# Patient Record
Sex: Male | Born: 1985 | Race: Black or African American | Hispanic: No | Marital: Single | Smoking: Never smoker
Health system: Southern US, Community
[De-identification: ages and names within clinical notes are randomized; demographics above are authoritative.]

---
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
## 2007-12-02 ENCOUNTER — Emergency Department: Payer: Self-pay | Admitting: Emergency Medicine

## 2009-04-08 ENCOUNTER — Emergency Department: Payer: Self-pay | Admitting: Emergency Medicine

## 2009-07-04 ENCOUNTER — Emergency Department: Payer: Self-pay | Admitting: Emergency Medicine

## 2010-02-26 ENCOUNTER — Emergency Department: Payer: Self-pay | Admitting: Emergency Medicine

## 2011-08-20 IMAGING — CT CT ABD-PELV W/ CM
1 of 3 series · 14 of 32 positions shown, 19 images · non-contrast
Comparison: none

REASON FOR EXAM: (1) generalized abdominal pain, vomiting; (2)
generalized abdominal pain, vomiti
COMMENTS:

PROCEDURE:     CT  - CT ABDOMEN / PELVIS  W  - February 27, 2010  [DATE]
RESULT:
TECHNIQUE: Helical 3 mm sections were obtained from the lung bases through
the pubic symphysis status post intravenous administration of 100 ml of
Tsovue-3I6 and oral contrast.

[Series 2: 3mm soft tissue · axial · 0.61mm/px · z∈[-316,+71]mm · 14 of 145 slices shown, 19 images]
[im 8/145  soft-tissue]
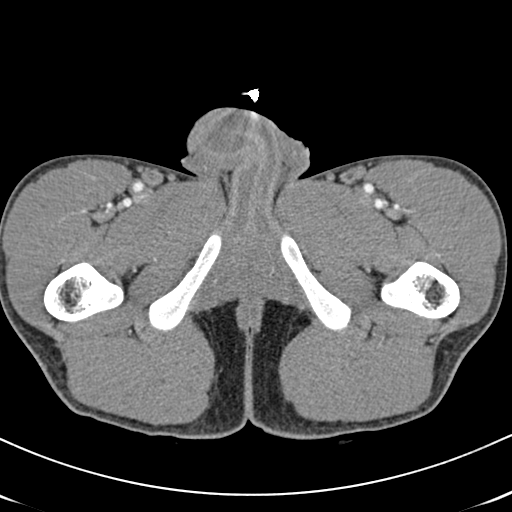
[im 8/145  bone]
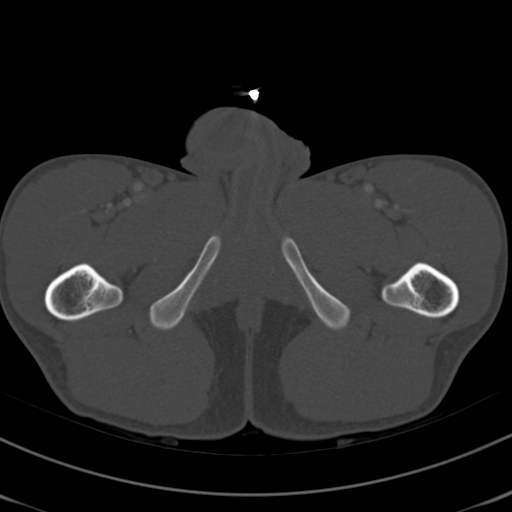
[im 23/145  soft-tissue]
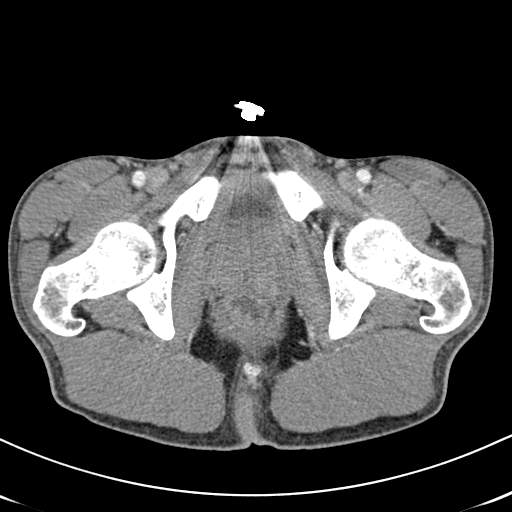
[im 31/145  soft-tissue]
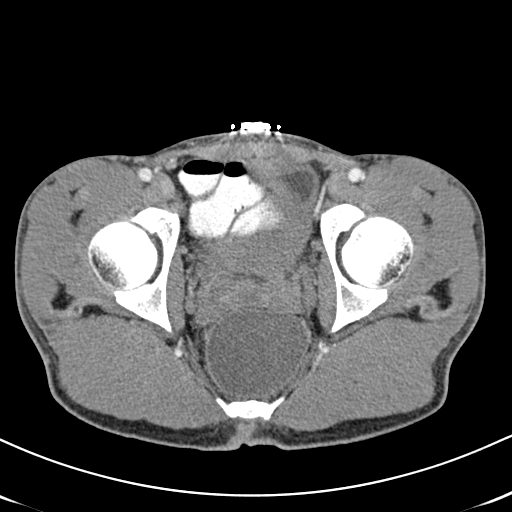
[im 38/145  soft-tissue]
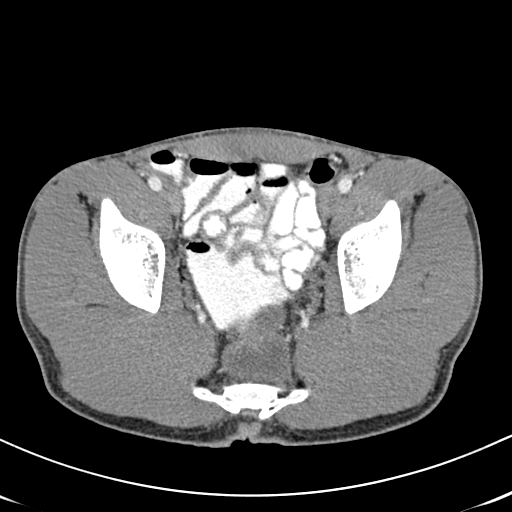
[im 54/145  soft-tissue]
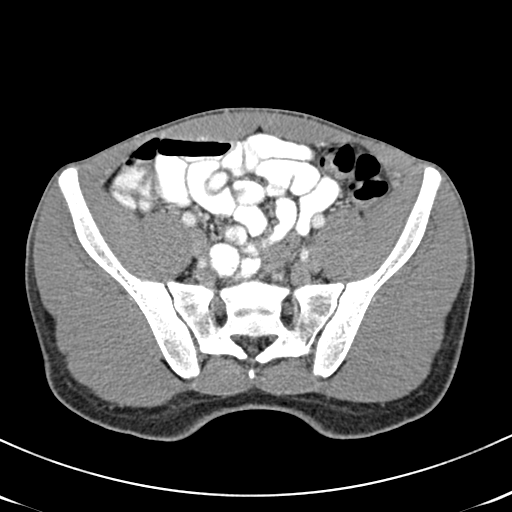
[im 61/145  soft-tissue]
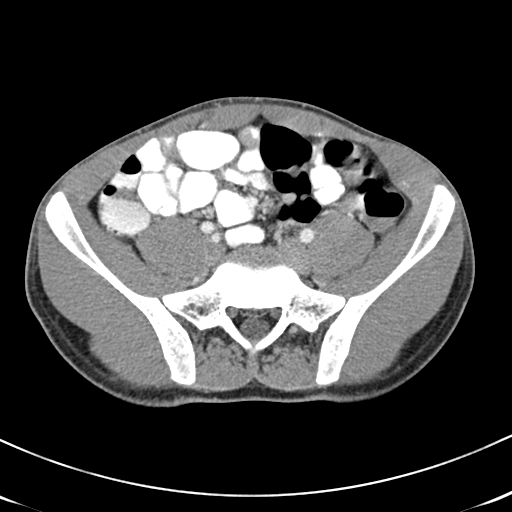
[im 76/145  soft-tissue]
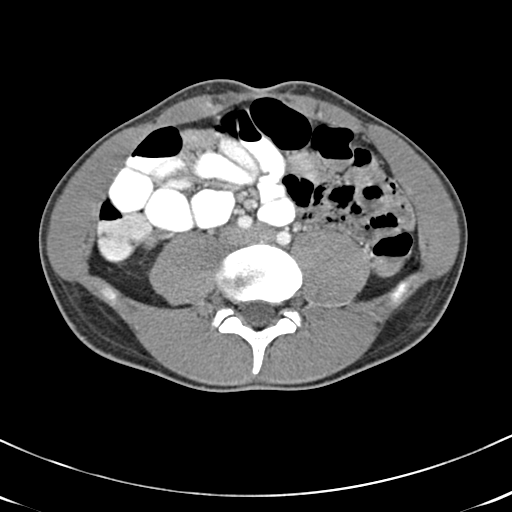
[im 84/145  soft-tissue]
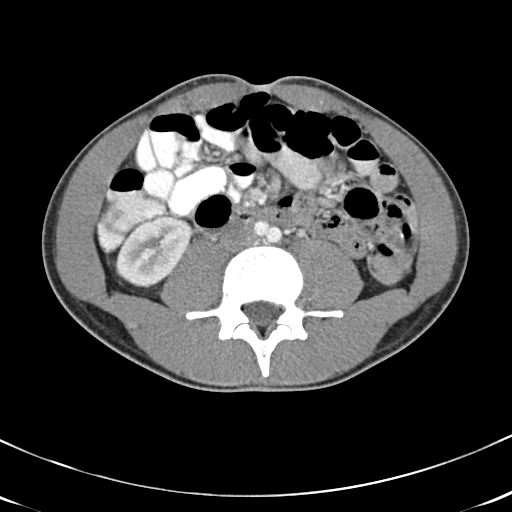
[im 91/145  soft-tissue]
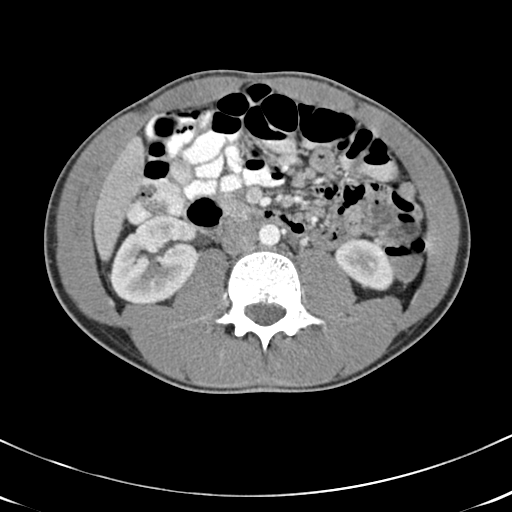
[im 91/145  bone]
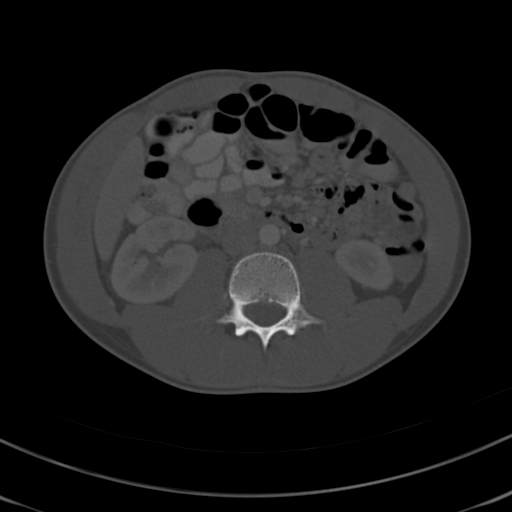
[im 107/145  soft-tissue]
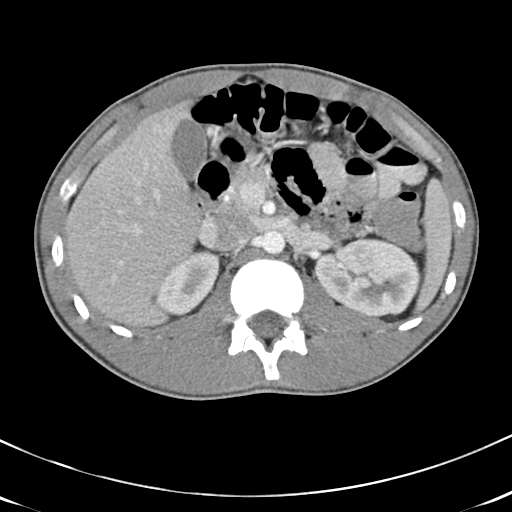
[im 114/145  soft-tissue]
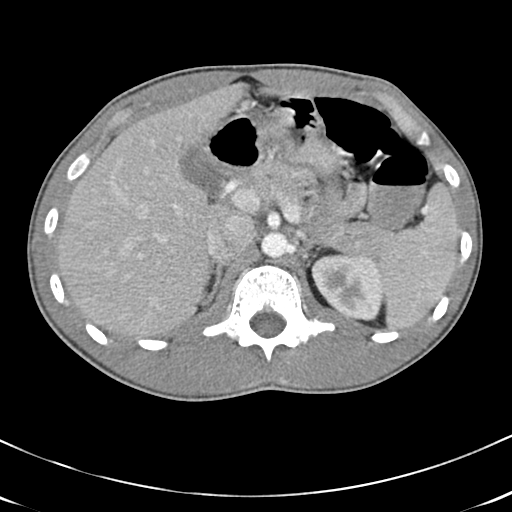
[im 114/145  lung]
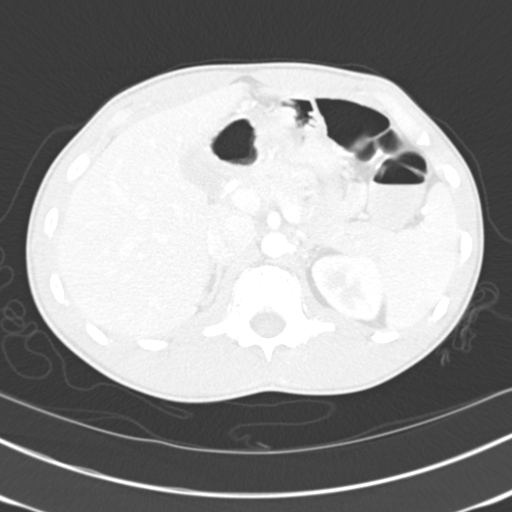
[im 122/145  soft-tissue]
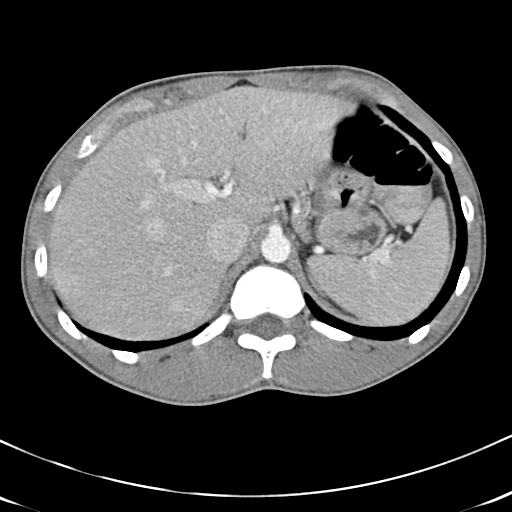
[im 122/145  lung]
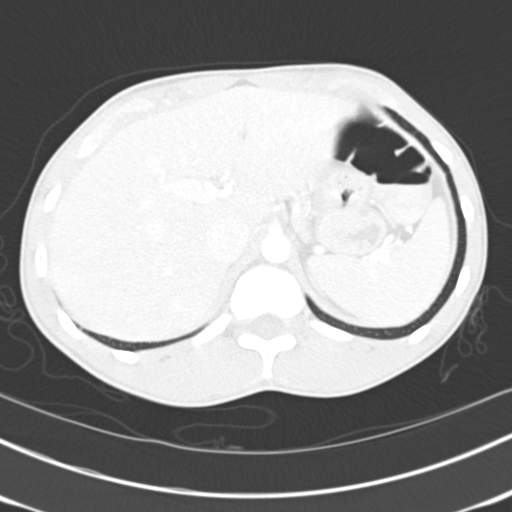
[im 129/145  lung]
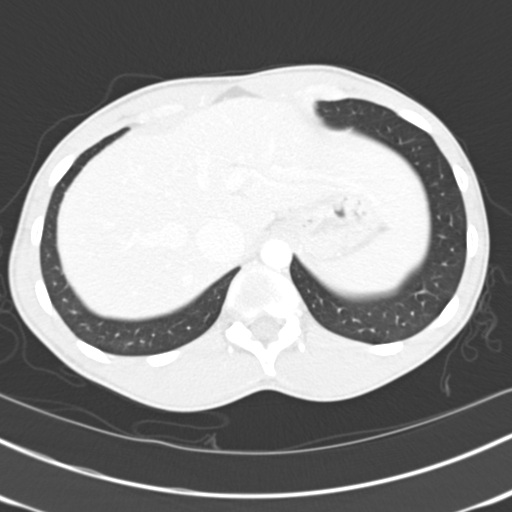
[im 137/145  soft-tissue]
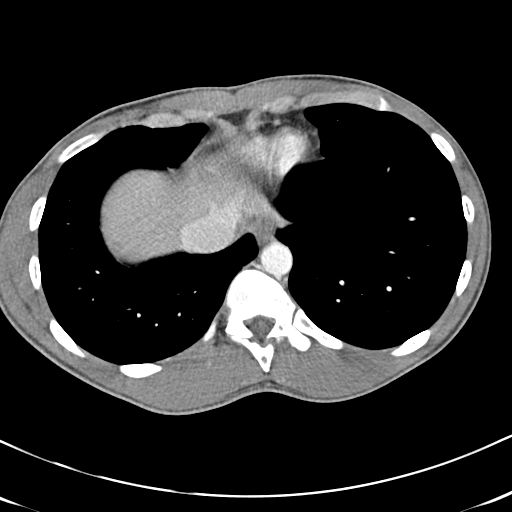
[im 137/145  lung]
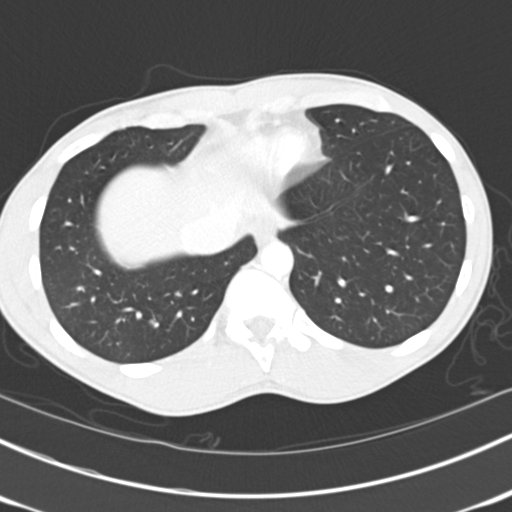

[14 of 32 positions shown; findings below may reference images not displayed]

FINDINGS: The lung bases are unremarkable.

The liver, spleen, adrenals, pancreas, and kidneys are unremarkable. There
is no CT evidence of bowel obstruction nor secondary signs reflecting
appendicitis, enteritis nor colitis. There is no evidence of an abdominal
aortic aneurysm. The celiac, SMA, IMA, and portal vein are unremarkable.

Evaluation of the pelvis demonstrates diffuse thickening of the urinary
bladder wall a component of which is secondary to incomplete distention. The
rectum is distended and fluid-filled.
IMPRESSION: 1. Urinary bladder wall thickening a component of which is secondary to
incomplete distention. An etiology such as cystitis cannot be excluded if
clinically appropriate.
2. Fluid-filled rectum.

Dr. Engelbrecht of the Emergency Department was informed of these findings via a
preliminary faxed report.

## 2012-12-30 ENCOUNTER — Emergency Department: Payer: Self-pay | Admitting: Emergency Medicine

## 2013-04-16 ENCOUNTER — Emergency Department: Payer: Self-pay | Admitting: Emergency Medicine

## 2013-04-16 LAB — URINALYSIS, COMPLETE
BACTERIA: NONE SEEN
BLOOD: NEGATIVE
Bilirubin,UR: NEGATIVE
Glucose,UR: NEGATIVE mg/dL (ref 0–75)
LEUKOCYTE ESTERASE: NEGATIVE
Nitrite: NEGATIVE
Ph: 6 (ref 4.5–8.0)
Protein: NEGATIVE
Specific Gravity: 1.026 (ref 1.003–1.030)
Squamous Epithelial: NONE SEEN

## 2013-04-16 LAB — COMPREHENSIVE METABOLIC PANEL
ALBUMIN: 4.5 g/dL (ref 3.4–5.0)
ALT: 20 U/L (ref 12–78)
Alkaline Phosphatase: 76 U/L
Anion Gap: 8 (ref 7–16)
BUN: 11 mg/dL (ref 7–18)
Bilirubin,Total: 1.1 mg/dL — ABNORMAL HIGH (ref 0.2–1.0)
CALCIUM: 9.1 mg/dL (ref 8.5–10.1)
CHLORIDE: 103 mmol/L (ref 98–107)
CO2: 25 mmol/L (ref 21–32)
CREATININE: 1.03 mg/dL (ref 0.60–1.30)
EGFR (African American): >60
EGFR (Non-African Amer.): >60
Glucose: 108 mg/dL — ABNORMAL HIGH (ref 65–99)
OSMOLALITY: 272 (ref 275–301)
Potassium: 3.6 mmol/L (ref 3.5–5.1)
SGOT(AST): 36 U/L (ref 15–37)
SODIUM: 136 mmol/L (ref 136–145)
Total Protein: 8.3 g/dL — ABNORMAL HIGH (ref 6.4–8.2)

## 2013-04-16 LAB — DRUG SCREEN, URINE
Amphetamines, Ur Screen: NEGATIVE (ref ?–1000)
Barbiturates, Ur Screen: POSITIVE (ref ?–200)
Benzodiazepine, Ur Scrn: NEGATIVE (ref ?–200)
Cannabinoid 50 Ng, Ur ~~LOC~~: POSITIVE (ref ?–50)
Cocaine Metabolite,Ur ~~LOC~~: NEGATIVE (ref ?–300)
MDMA (ECSTASY) UR SCREEN: NEGATIVE (ref ?–500)
Methadone, Ur Screen: NEGATIVE (ref ?–300)
Opiate, Ur Screen: NEGATIVE (ref ?–300)
Phencyclidine (PCP) Ur S: NEGATIVE (ref ?–25)
Tricyclic, Ur Screen: NEGATIVE (ref ?–1000)

## 2013-04-16 LAB — CBC
HCT: 42.5 % (ref 40.0–52.0)
HGB: 14.6 g/dL (ref 13.0–18.0)
MCH: 30 pg (ref 26.0–34.0)
MCHC: 34.4 g/dL (ref 32.0–36.0)
MCV: 87 fL (ref 80–100)
PLATELETS: 215 10*3/uL (ref 150–440)
RBC: 4.88 10*6/uL (ref 4.40–5.90)
RDW: 12.7 % (ref 11.5–14.5)
WBC: 7.4 10*3/uL (ref 3.8–10.6)

## 2013-04-16 LAB — ETHANOL: Ethanol %: <0.003 % (ref 0.000–0.080)

## 2013-04-16 LAB — ACETAMINOPHEN LEVEL
ACETAMINOPHEN: 6 ug/mL — AB
Acetaminophen: 9 ug/mL — ABNORMAL LOW

## 2013-04-16 LAB — SALICYLATE LEVEL: Salicylates, Serum: <1.7 mg/dL

## 2014-07-13 ENCOUNTER — Emergency Department
Admit: 2014-07-13 | Disposition: A | Discharge status: Home / Self Care | Payer: Self-pay | Admitting: Emergency Medicine

## 2014-07-15 NOTE — Consult Note (Signed)
PATIENT NAME:  Robert Baxter, Raheim A MR#:  [id number] DATE OF BIRTH:  February 06, 1986  DATE OF CONSULTATION:  04/17/2013  REFERRING PHYSICIAN:   CONSULTING PHYSICIAN:  Lavine Hargrove K. Abeeha Twist, MD  [facility] [place] [place].  SUBJECTIVE:  The patient is a 29 year old [place] male not employed, looking for a job and currently lives with family members.  The patient was brought to Emergency Room and according to information obtained the patient was not very compliant with medications and he is very antisocial and has no impulse control.  He likes to hurt himself, however, the patient denies these statements and he reported that he is eager to go home.    ALCOHOL AND DRUGS:  Occasionally drinks alcohol, denies drinking alcohol on a regular basis.    PAST PSYCHIATRIC HISTORY:  Denies any previous inpatient psychiatry.  Denies any suicide attempts.    OBJECTIVE:  Dressed in hospital clothes.  Alert and oriented, calm, pleasant and cooperative.  Denies feeling depressed, denies feeling hopeless or helpless.  Denies hearing voices or seeing things.  Denies any paranoia or suspicious ideas.  Contracts for safety, is eager to go home and look for a job.  Insight and judgment guarded.    IMPRESSION:  According to information obtained, impulse control disorder.    RECOMMENDATIONS:  The patient was [facility] because he is very antisocial with very poor impulse control.  Mr. Theodosia PalingKent Smith is to evaluate the patient on 04/18/2013 to see how he has been doing and consider appropriate placement as needed.    ____________________________ Jannet MantisSurya K. Guss Bundehalla, MD skc:cs D: 04/17/2013 18:17:48 ET T: 04/17/2013 19:28:49 ET JOB#: [id number]  cc: Monika SalkSurya K. Guss Bundehalla, MD, <Dictator> Beau FannySURYA K Margareth Kanner MD ELECTRONICALLY SIGNED 04/19/2013 6:45

## 2014-07-15 NOTE — Consult Note (Signed)
PATIENT NAME:  Robert Baxter, Robert Baxter MR#:  [id number] DATE OF BIRTH:  10-05-1985  DATE OF CONSULTATION:  04/18/2013  REFERRING PHYSICIAN:  Su Ley, MD CONSULTING PHYSICIAN:  Vonnetta Akey B. Essynce Munsch, MD  REASON FOR CONSULTATION: To evaluate suicidal patient.   IDENTIFYING DATA: Robert Baxter is a 29 year old male with no past psychiatric history.   CHIEF COMPLAINT: "I'm fine."   HISTORY OF PRESENT ILLNESS: Robert Baxter has no psychiatric history except for self-mutilating behavior as evidenced by bruises, scars on his forearm. The patient was brought to the hospital by his aunt. She found him sleep on the floor. The patient admitted to taking 7 Fioricet  that belonged to his girlfriend and were prescribed for her headaches. He denies that he overdosed in a suicide attempt at the time of my interview. At the beginning, he was bragging about taking pills and also indicated that in the past few months he tried to hang himself and then drown himself. To me, the patient denies any symptoms of depression, anxiety or psychosis. He admits to drinking alcohol on occasion, very infrequently. He denies using illicit substance. Of note, the patient was extremely irritable at the time of initial admission. The following day, he is pleasant, polite and cooperative, cool and collected. He is unable to explain this change in demeanor. He reports that he has a girl of 18 months and recently they have arguing some.  He punched a hole in the wall. He was upset when taking the pills.   PAST PSYCHIATRIC HISTORY: Denies any hospitalizations, no history of treatment. Admits to self-mutilating behavior.   FAMILY PSYCHIATRIC HISTORY: None reported.   PAST MEDICAL HISTORY: None.   ALLERGIES: No known drug allergies.   MEDICATIONS ON ADMISSION: None.   SOCIAL HISTORY: He lives with his aunt recently. Because of the heat-type problems, he  moved in with his grandma. His girlfriend lives with his parents. He believes that this is  a good relationship to last. He does not work and is looking for employment. He has some criminal past but denies any current charges pending.   REVIEW OF SYSTEMS: CONSTITUTIONAL: No fevers or chills. No weight changes.  EYES: No double or blurred vision.  ENT: No hearing loss.  RESPIRATORY: No shortness of breath or cough.  CARDIOVASCULAR: No chest pain or orthopnea.  GASTROINTESTINAL: No abdominal pain, nausea, vomiting or diarrhea.  GENITOURINARY: No incontinence or frequency.  ENDOCRINE: No heat or cold intolerance.  LYMPHATIC: No anemia or easy bruising.  INTEGUMENTARY: No acne or rash.  MUSCULOSKELETAL: No muscle or joint pain.  NEUROLOGIC: No tingling or weakness.  PSYCHIATRIC: See history of present illness for details.   PHYSICAL EXAMINATION: VITAL SIGNS: Blood pressure 106/68, pulse 70, respirations 18, temperature 97.1.  GENERAL: This is a slender young male in no acute distress. The rest of the physical examination is deferred to his primary attending.   LABORATORY, DIAGNOSTIC AND RADIOLOGICAL DATA:  Chemistries are within normal limits. Blood alcohol level is zero. LFTs within normal limits except for total bili of 1.1. Urine tox screen is positive for barbiturates and cannabinoids. CBC within normal limits. Urinalysis is not suggestive of urinary tract infection. Serum acetaminophen is 9, serum salicylates at 1.7. EKG normal sinus rhythm, possible left atrial enlargement, borderline EKG.   MENTAL STATUS EXAMINATION: The patient is alert and oriented to person, place, time and situation. He is pleasant, polite and cooperative. He is relaxed, no behavioral problems, no irritability. He maintains good eye contact. His speech is  of normal rhythm, rate and volume, rather soft. Mood is good with a full affect. Thought process is logical and goal oriented. Thought content: He denies suicidal or homicidal ideation. There are no delusions or paranoia. There are no auditory or visual  hallucinations. His cognition is grossly intact. His insight and judgment are questionable.    DIAGNOSES: AXIS I:  Cannabis abuse, sedative abuse.  AXIS II: Deferred.  AXIS III: Deferred.  AXIS IV: Mental illness, employment, financial.  AXIS V: Global assessment of functioning 45.   PLAN:  1.  The patient no longer meets criteria for involuntary inpatient psychiatric commitment. Please discharge as appropriate. I will terminate proceedings.    2.  No medication recommended.  3.  The patient was given information about [facility] treatment program.  4.  His family pick him up.    ____________________________ Ellin GoodieJolanta B. Jennet MaduroPucilowska, MD jbp:cs D: 04/18/2013 19:45:19 ET T: 04/18/2013 20:55:52 ET JOB#: [id number]  cc: Ayven Glasco B. Jennet MaduroPucilowska, MD, <Dictator> Shari ProwsJOLANTA B Navdeep Fessenden MD ELECTRONICALLY SIGNED 05/15/2013 6:42

## 2014-07-15 NOTE — Consult Note (Signed)
Brief Consult Note: Diagnosis: No diagnosis.   Patient was seen by consultant.   Consult note dictated.   Recommend further assessment or treatment.   Comments: Mr. Marice PotterDove has no psychiatric past. He was brought to ER after injesting 7 pills of Fioricet. He denies suicide intent. He denies suicidal or homicidal ideation.  PLAN: 1. The patgient mno longer meets crfiteria for IVC. I will termintae proceedings. Please discharge as appropriate.  2. No medications recommended.  3. He has information about crisis center.  4. His sister will pick him up.  Electronic Signatures: Kristine LineaPucilowska, Janeva Peaster (MD)  (Signed 26-Jan-15 14:23)  Authored: Brief Consult Note   Last Updated: 26-Jan-15 14:23 by Kristine LineaPucilowska, Cherylann Hobday (MD)

## 2014-07-27 ENCOUNTER — Emergency Department
Admission: EM | Admit: 2014-07-27 | Discharge: 2014-07-27 | Disposition: A | Discharge status: Home / Self Care | Payer: Self-pay | Attending: Emergency Medicine | Admitting: Emergency Medicine

## 2014-07-27 DIAGNOSIS — Y9289 Other specified places as the place of occurrence of the external cause: Secondary | ICD-10-CM | POA: Insufficient documentation

## 2014-07-27 DIAGNOSIS — Y9389 Activity, other specified: Secondary | ICD-10-CM | POA: Insufficient documentation

## 2014-07-27 DIAGNOSIS — Y998 Other external cause status: Secondary | ICD-10-CM | POA: Insufficient documentation

## 2014-07-27 DIAGNOSIS — X58XXXA Exposure to other specified factors, initial encounter: Secondary | ICD-10-CM | POA: Insufficient documentation

## 2014-07-27 DIAGNOSIS — Z0000 Encounter for general adult medical examination without abnormal findings: Secondary | ICD-10-CM | POA: Insufficient documentation

## 2014-07-27 DIAGNOSIS — IMO0001 Reserved for inherently not codable concepts without codable children: Secondary | ICD-10-CM

## 2014-07-27 LAB — COMPREHENSIVE METABOLIC PANEL
ALK PHOS: 55 U/L (ref 38–126)
ALT: 13 U/L — ABNORMAL LOW (ref 17–63)
AST: 33 U/L (ref 15–41)
Albumin: 4.6 g/dL (ref 3.5–5.0)
Anion gap: 12 (ref 5–15)
BUN: 15 mg/dL (ref 6–20)
CO2: 25 mmol/L (ref 22–32)
Calcium: 10 mg/dL (ref 8.9–10.3)
Chloride: 103 mmol/L (ref 101–111)
Creatinine, Ser: 1.01 mg/dL (ref 0.61–1.24)
GFR calc non Af Amer: >60 mL/min (ref >60)
GLUCOSE: 110 mg/dL — AB (ref 65–99)
POTASSIUM: 3.6 mmol/L (ref 3.5–5.1)
SODIUM: 140 mmol/L (ref 135–145)
TOTAL PROTEIN: 8.5 g/dL — AB (ref 6.5–8.1)
Total Bilirubin: 1.6 mg/dL — ABNORMAL HIGH (ref 0.3–1.2)

## 2014-07-27 LAB — URINE DRUG SCREEN, QUALITATIVE (ARMC ONLY)
AMPHETAMINES, UR SCREEN: 0 — AB
Barbiturates, Ur Screen: 0 — AB
Benzodiazepine, Ur Scrn: 0 — AB
CANNABINOID 50 NG, UR ~~LOC~~: 4 — AB
Cocaine Metabolite,Ur ~~LOC~~: 0 — AB
MDMA (Ecstasy)Ur Screen: 0 — AB
METHADONE SCREEN, URINE: 0 — AB
Opiate, Ur Screen: 0 — AB
Phencyclidine (PCP) Ur S: 0 — AB
Tricyclic, Ur Screen: 4 — AB

## 2014-07-27 LAB — URINALYSIS COMPLETE WITH MICROSCOPIC (ARMC ONLY)
BACTERIA UA: NONE SEEN
Bilirubin Urine: NEGATIVE
Glucose, UA: NEGATIVE mg/dL
Hgb urine dipstick: NEGATIVE
Leukocytes, UA: NEGATIVE
NITRITE: NEGATIVE
Protein, ur: NEGATIVE mg/dL
SPECIFIC GRAVITY, URINE: 1.025 (ref 1.005–1.030)
pH: 6 (ref 5.0–8.0)

## 2014-07-27 LAB — CBC WITH DIFFERENTIAL/PLATELET
Basophils Absolute: 0.1 10*3/uL (ref 0–0.1)
Basophils Relative: 1 %
EOS ABS: 0 10*3/uL (ref 0–0.7)
EOS PCT: 1 %
HCT: 45.6 % (ref 40.0–52.0)
Hemoglobin: 15.5 g/dL (ref 13.0–18.0)
LYMPHS PCT: 32 %
Lymphs Abs: 2.1 10*3/uL (ref 1.0–3.6)
MCH: 29.9 pg (ref 26.0–34.0)
MCHC: 34.1 g/dL (ref 32.0–36.0)
MCV: 87.9 fL (ref 80.0–100.0)
MONOS PCT: 10 %
Monocytes Absolute: 0.7 10*3/uL (ref 0.2–1.0)
Neutro Abs: 3.7 10*3/uL (ref 1.4–6.5)
Neutrophils Relative %: 56 %
PLATELETS: 229 10*3/uL (ref 150–440)
RBC: 5.18 MIL/uL (ref 4.40–5.90)
RDW: 13.2 % (ref 11.5–14.5)
WBC: 6.5 10*3/uL (ref 3.8–10.6)

## 2014-07-27 LAB — SALICYLATE LEVEL: Salicylate Lvl: <4 mg/dL (ref 2.8–30.0)

## 2014-07-27 LAB — ACETAMINOPHEN LEVEL: Acetaminophen (Tylenol), Serum: <10 ug/mL — ABNORMAL LOW (ref 10–30)

## 2014-07-27 LAB — ETHANOL: Alcohol, Ethyl (B): <5 mg/dL (ref <5)

## 2014-07-27 NOTE — ED Notes (Signed)
Pt to ER by [facility]. States his sister overreacted and sent him here for overdose. Pt denies taking anything. Admits to drinking 600mg  of caffine. Pt states that his sister told him he had to agree to come or come by a police car. Alert and oriented X4.

## 2014-07-27 NOTE — Discharge Instructions (Signed)
Return for any further problems. Do not takle any medicines not prescribed for you.

## 2014-07-27 NOTE — ED Provider Notes (Signed)
[facility] Emergency Department Provider Note    ____________________________________________  Time seen: 800 approx  I have reviewed the triage vital signs and the nursing notes.   HISTORY  Chief Complaint Drug Overdose       HPI Robert Baxter is a 29 y.o. male patient brought in by mother and sister sister reports she took some medicine and then stared off into space for 5 minutes she thinks he took an overdose patient denies this repeatedly and says he just took 600 mg caffeine this occurred at least 2 hours ago patient is currently asymptomatic walks and talks normally is talking to me normally and looking his cell phone I explained to patient and family I will check some blood work and urine and watch him for approximately 2 more hours and make sure he remains okay patient denies any past medical problems tells me he is not in school and not working at the present time he doesn't do much of anything   History reviewed. No pertinent past medical history.  There are no active problems to display for this patient.   History reviewed. No pertinent past surgical history.  No current outpatient prescriptions on file.  Allergies Review of patient's allergies indicates no known allergies.  No family history on file.  Social History History  Substance Use Topics  . Smoking status: Never Smoker   . Smokeless tobacco: Never Used  . Alcohol Use: No    Review of Systems  Eyes: Negative for visual changes. ENT: Negative for sore throat. Cardiovascular: Negative for chest pain. Respiratory: Negative for shortness of breath. Gastrointestinal: Negative for abdominal pain, vomiting and diarrhea. Genitourinary: Negative for dysuria. Musculoskeletal: Negative for back pain. Skin: Negative for rash. Neurological: Negative for headaches, focal weakness or numbness.   10-point ROS otherwise  negative.  ____________________________________________   PHYSICAL EXAM:  VITAL SIGNS: ED Triage Vitals  Enc Vitals Group     BP 07/27/14 1843 133/87 mmHg     Pulse Rate 07/27/14 1843 115     Resp 07/27/14 2005 18     Temp 07/27/14 1843 98.6 F (37 C)     Temp src --      SpO2 07/27/14 1843 100 %     Weight 07/27/14 1843 145 lb (65.772 kg)     Height 07/27/14 1843 5\' 11"  (1.803 m)     Head Cir --      Peak Flow --      Pain Score --      Pain Loc --      Pain Edu? --      Excl. in GC? --      Constitutional: Alert and oriented. Well appearing and in no distress. Eyes: Conjunctivae are normal. PERRL. Normal extraocular movements. ENT   Head: Normocephalic and atraumatic.   Nose: No congestion/rhinnorhea.   Mouth/Throat: Mucous membranes are moist.   Neck: No stridor. Hematological/Lymphatic/Immunilogical: No cervical lymphadenopathy. Cardiovascular: Normal rate, regular rhythm. Normal and symmetric distal pulses are present in all extremities. No murmurs, rubs, or gallops. Respiratory: Normal respiratory effort without tachypnea nor retractions. Breath sounds are clear and equal bilaterally. No wheezes/rales/rhonchi. Gastrointestinal: Soft and nontender. No distention. No abdominal bruits. There is no CVA tenderness.  Musculoskeletal: Nontender with normal range of motion in all extremities. No joint effusions.  No lower extremity tenderness nor edema. Neurologic:  Normal speech and language. No gross focal neurologic deficits are appreciated. Speech is normal. No gait instability. Skin:  Skin is warm,  dry and intact. No rash noted. Psychiatric: Mood and affect are normal. Speech and behavior are normal. Patient exhibits appropriate insight and judgment.  ____________________________________________    LABS (pertinent positives/negatives)  Urine drug screen appears to be positive for tricyclics although the results as recorded no manner different from the  one several days ago EKG shows normal sinus rhythm rate of 98 normal axis and QRS normal ST no acute changes. Patient has remained alert and oriented during his ER visit there has been no change in his mental status at all arch him at the present time but she since he still denies taking any overdose  ____________________________________________   EKG    ____________________________________________    RADIOLOGY    ____________________________________________   PROCEDURES  Procedure(s) performed: None  Critical Care performed: No  ____________________________________________   INITIAL IMPRESSION / ASSESSMENT AND PLAN / ED COURSE  Pertinent labs & imaging results that were available during my care of the patient were reviewed by me and considered in my medical decision making (see chart for details).    ____________________________________________   FINAL CLINICAL IMPRESSION(S) / ED DIAGNOSES  Final diagnoses:  Well adult    Arnaldo NatalPaul F Alahia Whicker, MD 07/29/14 [phone]

## 2014-07-27 NOTE — ED Notes (Signed)
Pt smiling, laughing about situation, ambulatory, color WNL. RR even and unlabored. VSS

## 2019-08-17 ENCOUNTER — Emergency Department: Payer: Self-pay

## 2019-08-17 ENCOUNTER — Other Ambulatory Visit: Payer: Self-pay

## 2019-08-17 ENCOUNTER — Encounter: Payer: Self-pay | Admitting: *Deleted

## 2019-08-17 ENCOUNTER — Emergency Department
Admission: EM | Admit: 2019-08-17 | Discharge: 2019-08-17 | Disposition: A | Discharge status: Home / Self Care | Payer: Self-pay | Attending: Emergency Medicine | Admitting: Emergency Medicine

## 2019-08-17 DIAGNOSIS — Z20822 Contact with and (suspected) exposure to covid-19: Secondary | ICD-10-CM | POA: Insufficient documentation

## 2019-08-17 DIAGNOSIS — B349 Viral infection, unspecified: Secondary | ICD-10-CM | POA: Insufficient documentation

## 2019-08-17 LAB — CBC
HCT: 42.1 % (ref 39.0–52.0)
Hemoglobin: 14.5 g/dL (ref 13.0–17.0)
MCH: 30 pg (ref 26.0–34.0)
MCHC: 34.4 g/dL (ref 30.0–36.0)
MCV: 87.2 fL (ref 80.0–100.0)
Platelets: 220 10*3/uL (ref 150–400)
RBC: 4.83 MIL/uL (ref 4.22–5.81)
RDW: 12.1 % (ref 11.5–15.5)
WBC: 5.2 10*3/uL (ref 4.0–10.5)
nRBC: 0 % (ref 0.0–0.2)

## 2019-08-17 LAB — BASIC METABOLIC PANEL
Anion gap: 10 (ref 5–15)
BUN: 15 mg/dL (ref 6–20)
CO2: 25 mmol/L (ref 22–32)
Calcium: 9.4 mg/dL (ref 8.9–10.3)
Chloride: 101 mmol/L (ref 98–111)
Creatinine, Ser: 0.9 mg/dL (ref 0.61–1.24)
GFR calc Af Amer: >60 mL/min (ref >60)
GFR calc non Af Amer: >60 mL/min (ref >60)
Glucose, Bld: 120 mg/dL — ABNORMAL HIGH (ref 70–99)
Potassium: 3.9 mmol/L (ref 3.5–5.1)
Sodium: 136 mmol/L (ref 135–145)

## 2019-08-17 LAB — SARS CORONAVIRUS 2 BY RT PCR (HOSPITAL ORDER, PERFORMED IN ~~LOC~~ HOSPITAL LAB): SARS Coronavirus 2: NEGATIVE

## 2019-08-17 MED ORDER — ONDANSETRON HCL 8 MG PO TABS: 8.0000 mg | ORAL_TABLET | Freq: Three times a day (TID) | ORAL | 0 refills | Status: AC | PRN

## 2019-08-17 MED ORDER — ONDANSETRON 8 MG PO TBDP
8.0000 mg | ORAL_TABLET | Freq: Once | ORAL | Status: AC
  Administered 2019-08-17: 8 mg via ORAL
  Filled 2019-08-17: qty 1

## 2019-08-17 NOTE — ED Notes (Signed)
See triage note  Presents with nausea,not feeling well and SOB  States the sxs' have been intermittent   Also has had some subjective fever

## 2019-08-17 NOTE — Discharge Instructions (Signed)
Follow discharge care instruction take medication as directed for nausea and vomiting.  Advised self quarantine pending results of COVID-19 test.  Results can be found in the MyChart app.

## 2019-08-17 NOTE — ED Triage Notes (Signed)
Pt reports he has been feeling bad for 1 week with n/v, dizziness intermittent fevers. Pt has a headache and loss of taste.  Pt states he was exposed to people he works with that has covid.  Pt alert.

## 2019-08-17 NOTE — ED Provider Notes (Signed)
[facility] Emergency Department Provider Note   ____________________________________________   First MD Initiated Contact with Patient 08/17/19 [phone]     (approximate)  I have reviewed the triage vital signs and the nursing notes.   HISTORY  Chief Complaint Nausea    HPI Robert Baxter is a 34 y.o. male patient complain of nausea/vomiting, vertigo, intermittent fever, headache, and loss of taste.  Patient state exposures people at his job of COVID-19.  No recent test.  Patient also complained of fatigue.  No palliative measures for complaint.         No past medical history on file.  There are no problems to display for this patient.   No past surgical history on file.  Prior to Admission medications   Medication Sig Start Date End Date Taking? Authorizing Provider  ondansetron (ZOFRAN) 8 MG tablet Take 1 tablet (8 mg total) by mouth every 8 (eight) hours as needed for nausea or vomiting. 08/17/19   Joni Reining, PA-C    Allergies Patient has no known allergies.  No family history on file.  Social History Social History   Tobacco Use  . Smoking status: Never Smoker  . Smokeless tobacco: Never Used  Substance Use Topics  . Alcohol use: No  . Drug use: No    Review of Systems Constitutional: No fever/chills.  Loss of taste.  Fatigue. Eyes: No visual changes. ENT: No sore throat. Cardiovascular: Denies chest pain. Respiratory: Denies shortness of breath. Gastrointestinal: No abdominal pain.  Nausea and vomiting.  No diarrhea.  No constipation. Genitourinary: Negative for dysuria. Musculoskeletal: Negative for back pain. Skin: Negative for rash. Neurological: Negative for headaches, focal weakness or numbness.   ____________________________________________   PHYSICAL EXAM:  VITAL SIGNS: ED Triage Vitals  Enc Vitals Group     BP 08/17/19 0135 118/80     Pulse Rate 08/17/19 0135 94     Resp 08/17/19 0135 16     Temp  08/17/19 0135 98.5 F (36.9 C)     Temp Source 08/17/19 0135 Oral     SpO2 08/17/19 0135 98 %     Weight 08/17/19 0137 150 lb (68 kg)     Height 08/17/19 0137 5\' 11"  (1.803 m)     Head Circumference --      Peak Flow --      Pain Score 08/17/19 0137 0     Pain Loc --      Pain Edu? --      Excl. in GC? --    Constitutional: Alert and oriented. Well appearing and in no acute distress. Cardiovascular: Normal rate, regular rhythm. Grossly normal heart sounds.  Good peripheral circulation. Respiratory: Normal respiratory effort.  No retractions. Lungs CTAB. Gastrointestinal: Soft and nontender. No distention. No abdominal bruits. No CVA tenderness. Skin:  Skin is warm, dry and intact. No rash noted. Psychiatric: Mood and affect are normal. Speech and behavior are normal.  ____________________________________________   LABS (all labs ordered are listed, but only abnormal results are displayed)  Labs Reviewed  BASIC METABOLIC PANEL - Abnormal; Notable for the following components:      Result Value   Glucose, Bld 120 (*)    All other components within normal limits  SARS CORONAVIRUS 2 BY RT PCR (HOSPITAL ORDER, PERFORMED IN [facility])  CBC   ____________________________________________  EKG   ____________________________________________  RADIOLOGY  ED MD interpretation:    Official radiology report(s): DG Chest 2 View  Result  Date: 08/17/2019 CLINICAL DATA:  Weakness and fever. Patient reports positive COVID exposure. EXAM: CHEST - 2 VIEW COMPARISON:  Radiograph 07/04/2009 FINDINGS: The cardiomediastinal contours are normal. Subsegmental opacities in both lung bases. Pulmonary vasculature is normal. No consolidation, pleural effusion, or pneumothorax. No acute osseous abnormalities are seen. IMPRESSION: Subsegmental opacities in both lung bases, favoring atelectasis. Electronically Signed   By: Keith Rake M.D.   On: 08/17/2019 02:12     ____________________________________________   PROCEDURES  Procedure(s) performed (including Critical Care):  Procedures   ____________________________________________   INITIAL IMPRESSION / ASSESSMENT AND PLAN / ED COURSE  As part of my medical decision making, I reviewed the following data within the [facility]   Patient presents with 1 week of nausea/vomiting, headache, fatigue, loss of taste.  Patient said exposure to COVID-19 at work.  Discussed lab results and x-ray findings with patient.  Patient advised self quarantine pending results of COVID-19 test.  Advised if test is positive was quarantine additional 10 days.  Patient given a prescription for Zofran for nausea and vomiting.       [name] was evaluated in Emergency Department on 08/17/2019 for the symptoms described in the history of present illness. He was evaluated in the context of the global COVID-19 pandemic, which necessitated consideration that the patient might be at risk for infection with the SARS-CoV-2 virus that causes COVID-19. Institutional protocols and algorithms that pertain to the evaluation of patients at risk for COVID-19 are in a state of rapid change based on information released by regulatory bodies including the CDC and federal and state organizations. These policies and algorithms were followed during the patient's care in the ED.       ____________________________________________   FINAL CLINICAL IMPRESSION(S) / ED DIAGNOSES  Final diagnoses:  Viral illness     ED Discharge Orders         Ordered    ondansetron (ZOFRAN) 8 MG tablet  Every 8 hours PRN     08/17/19 0739           Note:  This document was prepared using Dragon voice recognition software and may include unintentional dictation errors.    Sable Feil, PA-C 08/17/19 0347    Carrie Mew, MD 08/17/19 [phone]

## 2021-02-06 IMAGING — CR DG CHEST 2V
2 series · 2 of 2 positions shown · non-contrast
Comparison: Radiograph 07/04/2009

CLINICAL DATA: Weakness and fever. Patient reports positive COVID
exposure.

EXAM:
CHEST - 2 VIEW

[chest pa]
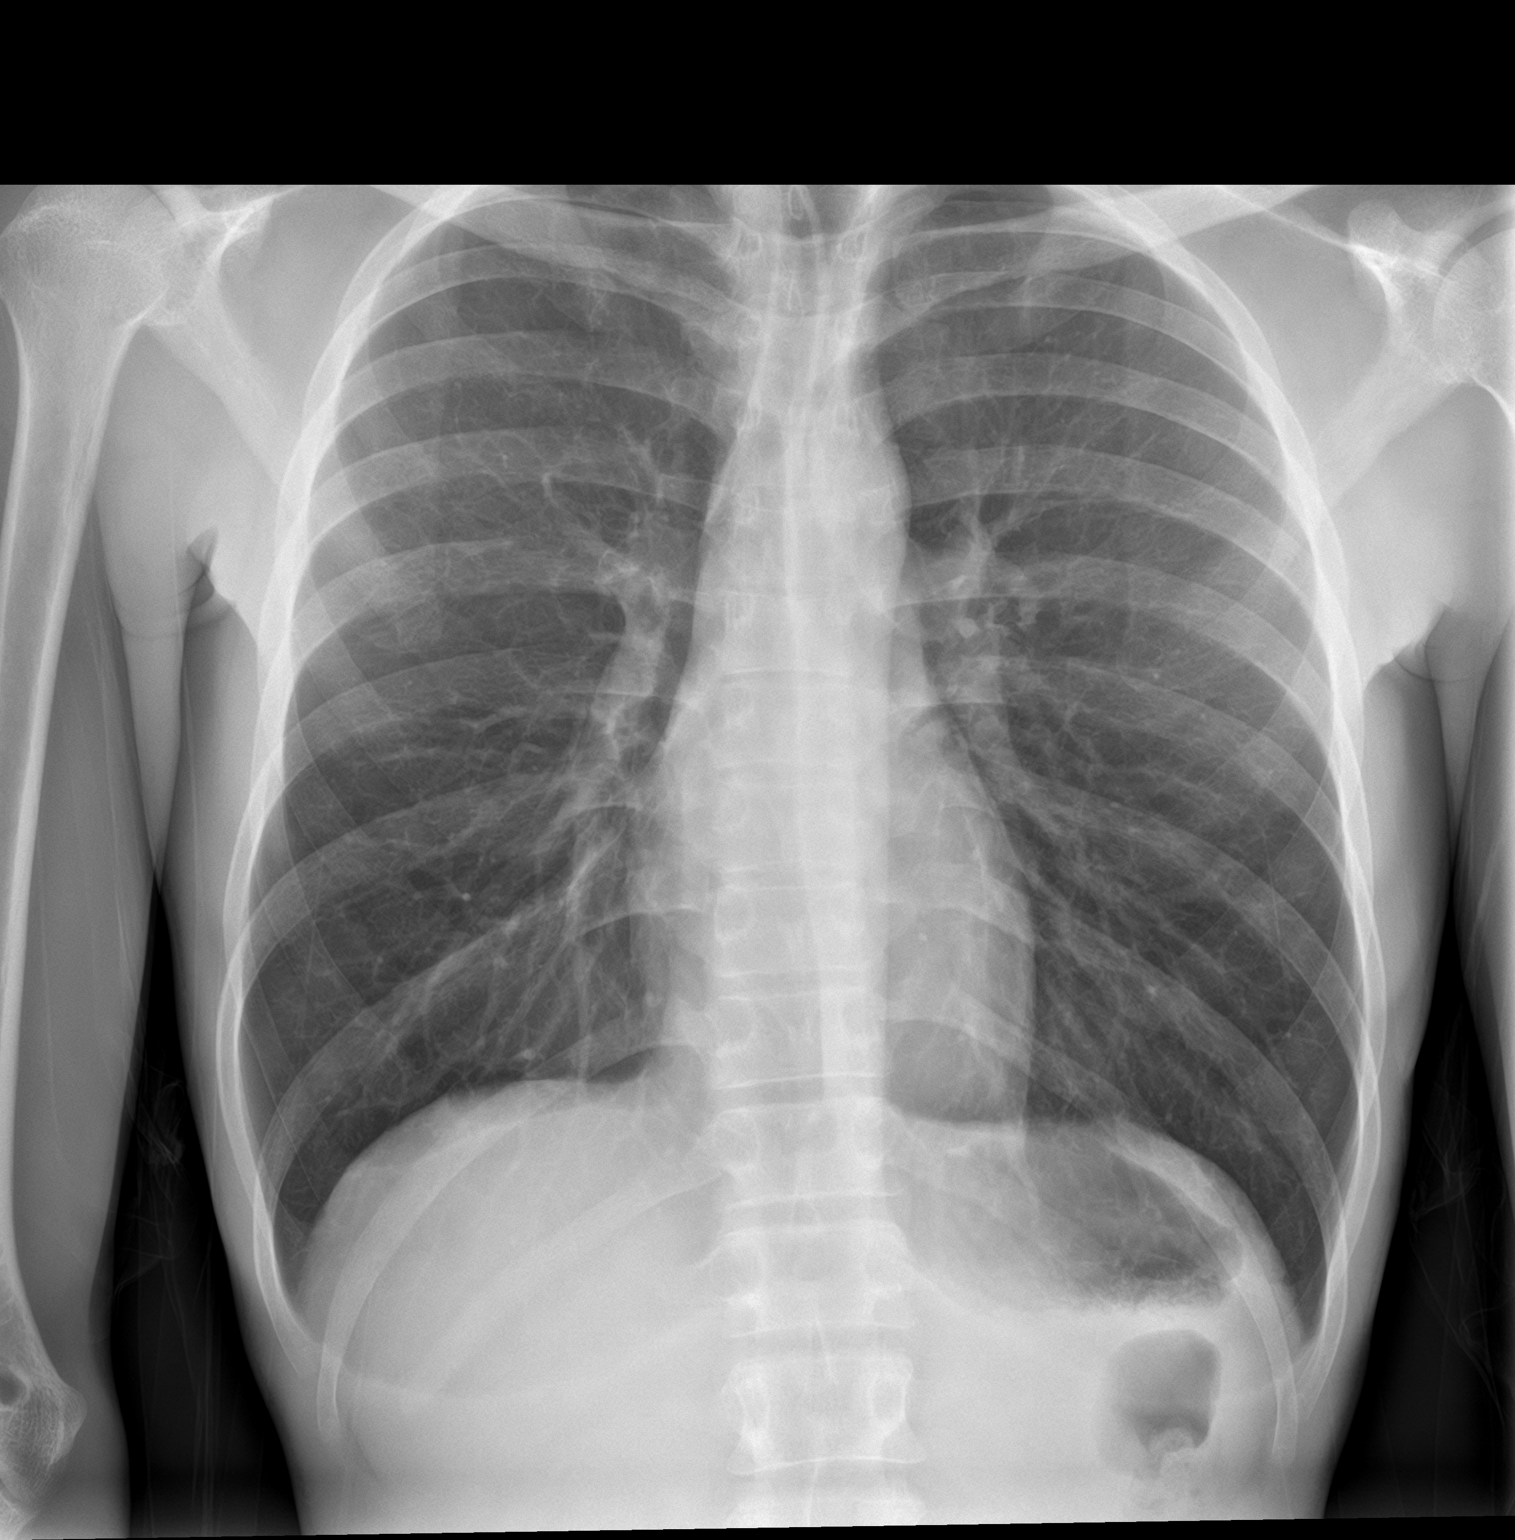

[chest lat]
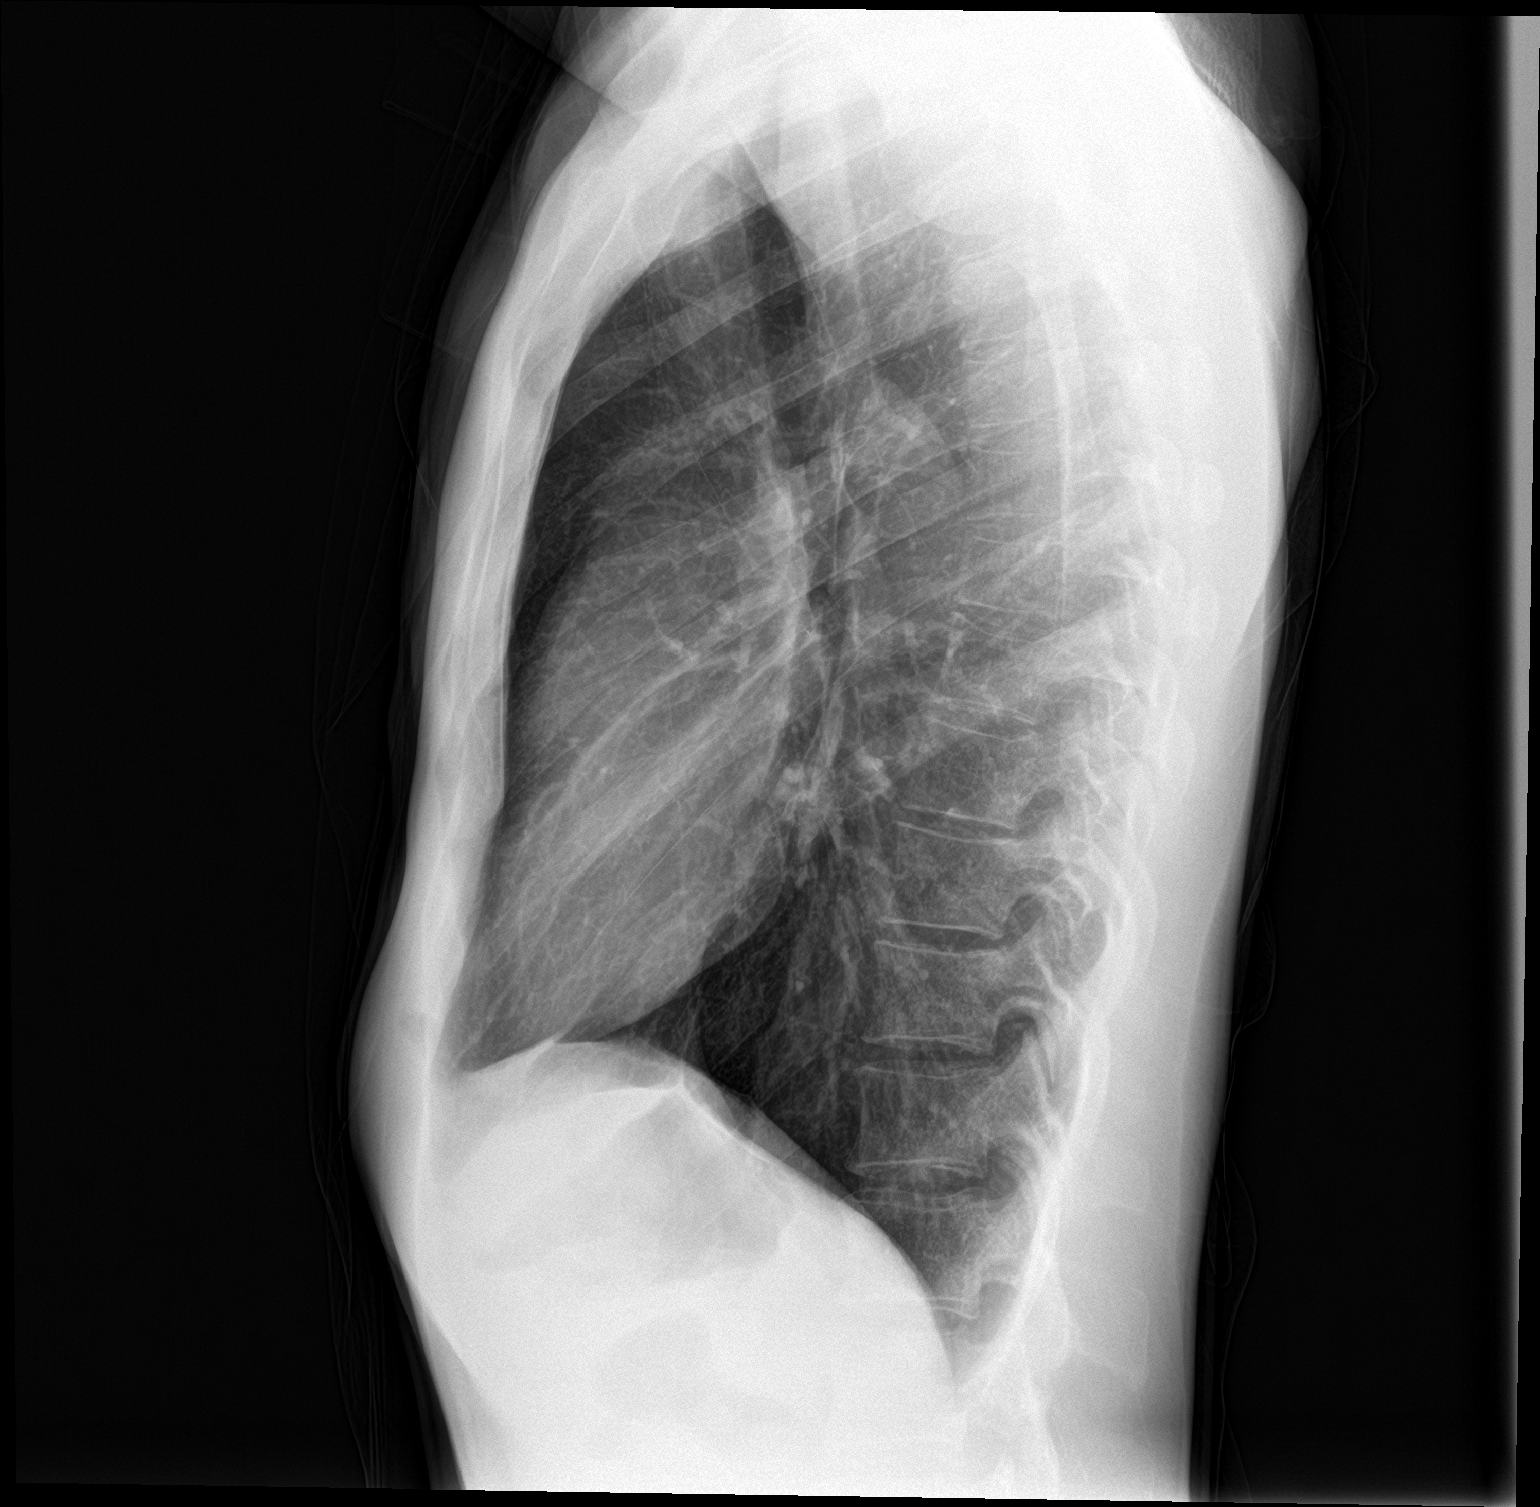

[2 of 2 positions shown; findings below may reference images not displayed]

FINDINGS: The cardiomediastinal contours are normal. Subsegmental opacities in
both lung bases. Pulmonary vasculature is normal. No consolidation,
pleural effusion, or pneumothorax. No acute osseous abnormalities
are seen.
IMPRESSION: Subsegmental opacities in both lung bases, favoring atelectasis.
